# Patient Record
Sex: Male | Born: 1975 | Race: White | Hispanic: No | Marital: Single | Smoking: Former smoker
Health system: Southern US, Community
[De-identification: ages and names within clinical notes are randomized; demographics above are authoritative.]

## PROBLEM LIST (undated history)

## (undated) DIAGNOSIS — K219 Gastro-esophageal reflux disease without esophagitis: Secondary | ICD-10-CM

## (undated) DIAGNOSIS — M1990 Unspecified osteoarthritis, unspecified site: Secondary | ICD-10-CM

## (undated) DIAGNOSIS — F329 Major depressive disorder, single episode, unspecified: Secondary | ICD-10-CM

## (undated) DIAGNOSIS — E785 Hyperlipidemia, unspecified: Secondary | ICD-10-CM

## (undated) DIAGNOSIS — F32A Depression, unspecified: Secondary | ICD-10-CM

## (undated) HISTORY — DX: Major depressive disorder, single episode, unspecified: F32.9

## (undated) HISTORY — DX: Depression, unspecified: F32.A

---

## 2003-03-06 ENCOUNTER — Emergency Department (HOSPITAL_COMMUNITY): Admission: EM | Admit: 2003-03-06 | Discharge: 2003-03-06 | Payer: Self-pay | Admitting: Emergency Medicine

## 2003-08-11 ENCOUNTER — Emergency Department (HOSPITAL_COMMUNITY): Admission: EM | Admit: 2003-08-11 | Discharge: 2003-08-11 | Payer: Self-pay | Admitting: Emergency Medicine

## 2012-09-16 ENCOUNTER — Ambulatory Visit: Payer: PRIVATE HEALTH INSURANCE | Admitting: Family Medicine

## 2012-09-16 ENCOUNTER — Ambulatory Visit: Payer: PRIVATE HEALTH INSURANCE

## 2012-09-16 DIAGNOSIS — M549 Dorsalgia, unspecified: Secondary | ICD-10-CM

## 2012-09-16 DIAGNOSIS — T148XXA Other injury of unspecified body region, initial encounter: Secondary | ICD-10-CM

## 2012-09-16 DIAGNOSIS — R0781 Pleurodynia: Secondary | ICD-10-CM

## 2012-09-16 DIAGNOSIS — M25519 Pain in unspecified shoulder: Secondary | ICD-10-CM

## 2012-09-16 DIAGNOSIS — IMO0002 Reserved for concepts with insufficient information to code with codable children: Secondary | ICD-10-CM

## 2012-09-16 DIAGNOSIS — M542 Cervicalgia: Secondary | ICD-10-CM

## 2012-09-16 MED ORDER — CYCLOBENZAPRINE HCL 5 MG PO TABS: 5.0000 mg | ORAL_TABLET | Freq: Three times a day (TID) | ORAL | Status: DC | PRN

## 2012-09-16 MED ORDER — IBUPROFEN 800 MG PO TABS: 800.0000 mg | ORAL_TABLET | Freq: Three times a day (TID) | ORAL | Status: AC | PRN
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
## 2012-09-16 NOTE — Patient Instructions (Addendum)

## 2012-09-22 ENCOUNTER — Telehealth: Payer: Self-pay

## 2012-09-22 NOTE — Telephone Encounter (Signed)
Patient requesting copies of recent xrays. Please call when ready [phone]

## 2012-09-22 NOTE — Telephone Encounter (Signed)
I have requested for XRAY to make a copy for pt.

## 2012-09-27 ENCOUNTER — Telehealth: Payer: Self-pay
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
## 2012-09-28 ENCOUNTER — Ambulatory Visit (INDEPENDENT_AMBULATORY_CARE_PROVIDER_SITE_OTHER): Payer: PRIVATE HEALTH INSURANCE | Admitting: Family Medicine

## 2012-09-28 VITALS — BP 130/91 | HR 97 | Temp 98.1°F | Resp 18 | Ht 67.0 in | Wt 170.0 lb

## 2012-09-28 DIAGNOSIS — M545 Low back pain, unspecified: Secondary | ICD-10-CM

## 2012-09-28 DIAGNOSIS — T148XXA Other injury of unspecified body region, initial encounter: Secondary | ICD-10-CM

## 2012-09-28 DIAGNOSIS — M546 Pain in thoracic spine: Secondary | ICD-10-CM

## 2012-09-28 NOTE — Telephone Encounter (Signed)
Spoke with patient. He will be fasttracked for re-eval today. Thanks. TLe
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
## 2012-10-26 ENCOUNTER — Ambulatory Visit (INDEPENDENT_AMBULATORY_CARE_PROVIDER_SITE_OTHER): Payer: 59 | Admitting: Family Medicine

## 2012-10-26 VITALS — BP 120/77 | HR 80 | Temp 98.3°F | Resp 18 | Wt 166.0 lb

## 2012-10-26 DIAGNOSIS — S339XXA Sprain of unspecified parts of lumbar spine and pelvis, initial encounter: Secondary | ICD-10-CM

## 2012-10-26 DIAGNOSIS — T148XXA Other injury of unspecified body region, initial encounter: Secondary | ICD-10-CM

## 2012-10-26 DIAGNOSIS — M549 Dorsalgia, unspecified: Secondary | ICD-10-CM

## 2012-10-26 MED ORDER — CYCLOBENZAPRINE HCL 5 MG PO TABS: 5.0000 mg | ORAL_TABLET | Freq: Three times a day (TID) | ORAL | Status: AC | PRN
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
## 2015-02-08 ENCOUNTER — Telehealth: Payer: Self-pay

## 2015-02-08 NOTE — Telephone Encounter (Signed)
Melanie from Dr. Marcial Pacasimothy Welburn's office left message wanting to know if this patient has been seen in our office since October 26, 2012. Returned call at [phone] and left message stating he has not been in our office since that date of service.

## 2015-07-19 ENCOUNTER — Other Ambulatory Visit: Payer: Self-pay | Admitting: Orthopedic Surgery

## 2015-07-19 DIAGNOSIS — M546 Pain in thoracic spine: Secondary | ICD-10-CM

## 2015-07-19 DIAGNOSIS — M5412 Radiculopathy, cervical region: Secondary | ICD-10-CM

## 2015-07-19 DIAGNOSIS — M5416 Radiculopathy, lumbar region: Secondary | ICD-10-CM

## 2015-08-05 ENCOUNTER — Ambulatory Visit
Admission: RE | Admit: 2015-08-05 | Discharge: 2015-08-05 | Disposition: A | Discharge status: Home / Self Care | Payer: 59 | Source: Ambulatory Visit | Attending: Orthopedic Surgery | Admitting: Orthopedic Surgery

## 2015-08-05 DIAGNOSIS — M5416 Radiculopathy, lumbar region: Secondary | ICD-10-CM

## 2015-08-05 DIAGNOSIS — M546 Pain in thoracic spine: Secondary | ICD-10-CM

## 2015-08-05 DIAGNOSIS — M5412 Radiculopathy, cervical region: Secondary | ICD-10-CM

## 2024-04-30 ENCOUNTER — Other Ambulatory Visit: Payer: Self-pay | Admitting: Orthopedic Surgery

## 2024-05-06 ENCOUNTER — Encounter (HOSPITAL_BASED_OUTPATIENT_CLINIC_OR_DEPARTMENT_OTHER): Payer: Self-pay | Admitting: Orthopedic Surgery

## 2024-05-13 ENCOUNTER — Ambulatory Visit (HOSPITAL_BASED_OUTPATIENT_CLINIC_OR_DEPARTMENT_OTHER): Admit: 2024-05-13 | Admitting: Orthopedic Surgery

## 2024-05-13 ENCOUNTER — Encounter (HOSPITAL_BASED_OUTPATIENT_CLINIC_OR_DEPARTMENT_OTHER): Payer: Self-pay

## 2024-05-13 HISTORY — DX: Unspecified osteoarthritis, unspecified site: M19.90

## 2024-05-13 HISTORY — DX: Hyperlipidemia, unspecified: E78.5

## 2024-05-13 HISTORY — DX: Gastro-esophageal reflux disease without esophagitis: K21.9

## 2024-05-13 SURGERY — EXCISION, OSTEOCHONDROMA
Anesthesia: Choice | Site: Little Finger | Laterality: Right
